# Patient Record
Sex: Female | Born: 1955 | Race: White | Hispanic: No | Marital: Married | Smoking: Never smoker
Health system: Southern US, Community
[De-identification: ages and names within clinical notes are randomized; demographics above are authoritative.]

## PROBLEM LIST (undated history)

## (undated) DIAGNOSIS — F329 Major depressive disorder, single episode, unspecified: Secondary | ICD-10-CM

## (undated) DIAGNOSIS — F32A Depression, unspecified: Secondary | ICD-10-CM

## (undated) DIAGNOSIS — E785 Hyperlipidemia, unspecified: Secondary | ICD-10-CM

## (undated) DIAGNOSIS — F419 Anxiety disorder, unspecified: Secondary | ICD-10-CM

## (undated) HISTORY — PX: CHOLECYSTECTOMY: SHX55

## (undated) HISTORY — DX: Anxiety disorder, unspecified: F41.9

## (undated) HISTORY — DX: Hyperlipidemia, unspecified: E78.5

## (undated) HISTORY — DX: Depression, unspecified: F32.A

---
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
## 2019-09-04 ENCOUNTER — Encounter: Payer: Self-pay | Admitting: Emergency Medicine

## 2019-09-04 ENCOUNTER — Other Ambulatory Visit: Payer: Self-pay

## 2019-09-04 DIAGNOSIS — I951 Orthostatic hypotension: Secondary | ICD-10-CM | POA: Insufficient documentation

## 2019-09-04 DIAGNOSIS — R21 Rash and other nonspecific skin eruption: Secondary | ICD-10-CM | POA: Diagnosis not present

## 2019-09-04 DIAGNOSIS — R55 Syncope and collapse: Secondary | ICD-10-CM | POA: Diagnosis present

## 2019-09-04 DIAGNOSIS — E119 Type 2 diabetes mellitus without complications: Secondary | ICD-10-CM | POA: Insufficient documentation

## 2019-09-04 DIAGNOSIS — R911 Solitary pulmonary nodule: Secondary | ICD-10-CM | POA: Diagnosis not present

## 2019-09-04 LAB — BASIC METABOLIC PANEL
Anion gap: 11 (ref 5–15)
BUN: 15 mg/dL (ref 8–23)
CO2: 21 mmol/L — ABNORMAL LOW (ref 22–32)
Calcium: 7.8 mg/dL — ABNORMAL LOW (ref 8.9–10.3)
Chloride: 99 mmol/L (ref 98–111)
Creatinine, Ser: 1.01 mg/dL — ABNORMAL HIGH (ref 0.44–1.00)
GFR calc Af Amer: >60 mL/min (ref >60)
GFR calc non Af Amer: 59 mL/min — ABNORMAL LOW (ref >60)
Glucose, Bld: 224 mg/dL — ABNORMAL HIGH (ref 70–99)
Potassium: 3.5 mmol/L (ref 3.5–5.1)
Sodium: 131 mmol/L — ABNORMAL LOW (ref 135–145)

## 2019-09-04 LAB — CBC
HCT: 43.7 % (ref 36.0–46.0)
Hemoglobin: 14.6 g/dL (ref 12.0–15.0)
MCH: 30.2 pg (ref 26.0–34.0)
MCHC: 33.4 g/dL (ref 30.0–36.0)
MCV: 90.3 fL (ref 80.0–100.0)
Platelets: 212 10*3/uL (ref 150–400)
RBC: 4.84 MIL/uL (ref 3.87–5.11)
RDW: 12.9 % (ref 11.5–15.5)
WBC: 11.3 10*3/uL — ABNORMAL HIGH (ref 4.0–10.5)
nRBC: 0 % (ref 0.0–0.2)

## 2019-09-04 NOTE — ED Triage Notes (Signed)
Pt arrives POV to triage with c/o syncopal episode around 2200. Pt states that she has just been diagnosed with shingles. Pt reports that she is healing from the shingles at this time.

## 2019-09-05 ENCOUNTER — Emergency Department: Payer: Medicare Other

## 2019-09-05 ENCOUNTER — Emergency Department
Admission: EM | Admit: 2019-09-05 | Discharge: 2019-09-05 | Disposition: A | Discharge status: Home / Self Care | Payer: Medicare Other | Attending: Emergency Medicine | Admitting: Emergency Medicine

## 2019-09-05 DIAGNOSIS — R55 Syncope and collapse: Secondary | ICD-10-CM

## 2019-09-05 DIAGNOSIS — R911 Solitary pulmonary nodule: Secondary | ICD-10-CM

## 2019-09-05 DIAGNOSIS — I951 Orthostatic hypotension: Secondary | ICD-10-CM

## 2019-09-05 LAB — URINALYSIS, COMPLETE (UACMP) WITH MICROSCOPIC
Bilirubin Urine: NEGATIVE
Glucose, UA: NEGATIVE mg/dL
Hgb urine dipstick: NEGATIVE
Ketones, ur: NEGATIVE mg/dL
Nitrite: NEGATIVE
Protein, ur: NEGATIVE mg/dL
Specific Gravity, Urine: 1.01 (ref 1.005–1.030)
pH: 5 (ref 5.0–8.0)

## 2019-09-05 LAB — TROPONIN I (HIGH SENSITIVITY)
Troponin I (High Sensitivity): 3 ng/L (ref <18)
Troponin I (High Sensitivity): <2 ng/L (ref <18)

## 2019-09-05 LAB — FIBRIN DERIVATIVES D-DIMER (ARMC ONLY): Fibrin derivatives D-dimer (ARMC): 4656.57 ng/mL (FEU) — ABNORMAL HIGH (ref 0.00–499.00)

## 2019-09-05 MED ORDER — FOSFOMYCIN TROMETHAMINE 3 G PO PACK
3.0000 g | PACK | Freq: Once | ORAL | Status: AC
  Administered 2019-09-05: 3 g via ORAL
  Filled 2019-09-05: qty 3

## 2019-09-05 MED ORDER — IOHEXOL 350 MG/ML SOLN
75.0000 mL | Freq: Once | INTRAVENOUS | Status: AC | PRN
  Administered 2019-09-05: 75 mL via INTRAVENOUS

## 2019-09-05 MED ORDER — SODIUM CHLORIDE 0.9 % IV BOLUS
1000.0000 mL | Freq: Once | INTRAVENOUS | Status: AC
  Administered 2019-09-05: 04:00:00 1000 mL via INTRAVENOUS

## 2019-09-05 NOTE — ED Notes (Signed)
Patient assisted to the bathroom 

## 2019-09-05 NOTE — Discharge Instructions (Addendum)
Drink plenty of fluids daily.  Get up slowly from a laying down or sitting position to avoid dizziness.  Your doctor will need to keep an eye on your lung nodules, generally with repeat CT scan in 1 year.  Return to the ER for recurrent or worsening symptoms, persistent vomiting, difficulty breathing or other concerns.

## 2019-09-05 NOTE — ED Provider Notes (Signed)
[facility] Emergency Department Provider Note   ____________________________________________   First MD Initiated Contact with Patient 09/05/19 0142     (approximate)  I have reviewed the triage vital signs and the nursing notes.   HISTORY  Chief Complaint Loss of Consciousness    HPI Allison Coleman is a 64 y.o. female who presents to the ED from home with a chief complaint of syncope.  Patient reports getting up to go into the kitchen and around 10 PM, felt lightheaded and passed out briefly.  She has been experiencing left flank pain and rash for the last 2 weeks.  Saw her PCP 3 days ago and diagnosed with shingles.  Because the lesions had already crusted over, PCP just recommended over-the-counter lidocaine, Tylenol/ibuprofen for pain.  Patient was going to get the lidocaine for pain when she passed out.  The night before she noted some shallow breathing because it was painful on her left side to take deep breaths.  Denies fever, cough, chest pain, abdominal pain, nausea, vomiting, diarrhea.       Past medical history GAD Depression Diabetes type 2  There are no problems to display for this patient.   Past Surgical History:  Procedure Laterality Date  . CHOLECYSTECTOMY      Prior to Admission medications   Not on File    Allergies Bupropion, Buspirone, Fluoxetine, Guaifenesin, Other, Penicillin g, and Citalopram  No family history on file.  Social History Social History   Tobacco Use  . Smoking status: Never Smoker  . Smokeless tobacco: Never Used  Substance Use Topics  . Alcohol use: Never  . Drug use: Never    Review of Systems  Constitutional: No fever/chills Eyes: No visual changes. ENT: No sore throat. Cardiovascular: Denies chest pain. Respiratory: Denies shortness of breath. Gastrointestinal: No abdominal pain.  No nausea, no vomiting.  No diarrhea.  No constipation. Genitourinary: Negative for  dysuria. Musculoskeletal: Negative for back pain. Skin: Positive for rash. Neurological: Positive for syncope.  Negative for headaches, focal weakness or numbness.   ____________________________________________   PHYSICAL EXAM:  VITAL SIGNS: ED Triage Vitals  Enc Vitals Group     BP 09/04/19 2303 (!) 96/53     Pulse Rate 09/04/19 2303 (!) 118     Resp 09/04/19 2303 18     Temp 09/04/19 2303 98.3 F (36.8 C)     Temp Source 09/04/19 2303 Oral     SpO2 09/04/19 2303 97 %     Weight 09/04/19 2257 200 lb (90.7 kg)     Height 09/04/19 2257 5\' 5"  (1.651 m)     Head Circumference --      Peak Flow --      Pain Score 09/04/19 2257 0     Pain Loc --      Pain Edu? --      Excl. in GC? --     Constitutional: Alert and oriented. Well appearing and in no acute distress. Eyes: Conjunctivae are normal. PERRL. EOMI. Head: Atraumatic. Nose: Atraumatic. Mouth/Throat: Mucous membranes are mildly dry.  No dental malocclusion. Neck: No stridor.  No cervical spine tenderness to palpation. Cardiovascular: Tachycardic rate, regular rhythm. Grossly normal heart sounds.  Good peripheral circulation. Respiratory: Normal respiratory effort.  No retractions. Lungs CTAB. Gastrointestinal: Soft and nontender to light or deep palpation. No distention. No abdominal bruits. No CVA tenderness. Musculoskeletal: No lower extremity tenderness nor edema.  No joint effusions. Neurologic:  Normal speech and language. No gross focal  neurologic deficits are appreciated.  Skin:  Skin is warm, dry and intact.  Dried vesicles noted to left trunk. Psychiatric: Mood and affect are normal. Speech and behavior are normal.  ____________________________________________   LABS (all labs ordered are listed, but only abnormal results are displayed)  Labs Reviewed  BASIC METABOLIC PANEL - Abnormal; Notable for the following components:      Result Value   Sodium 131 (*)    CO2 21 (*)    Glucose, Bld 224 (*)     Creatinine, Ser 1.01 (*)    Calcium 7.8 (*)    GFR calc non Af Amer 59 (*)    All other components within normal limits  CBC - Abnormal; Notable for the following components:   WBC 11.3 (*)    All other components within normal limits  URINALYSIS, COMPLETE (UACMP) WITH MICROSCOPIC - Abnormal; Notable for the following components:   Color, Urine YELLOW (*)    APPearance HAZY (*)    Leukocytes,Ua SMALL (*)    Bacteria, UA RARE (*)    All other components within normal limits  FIBRIN DERIVATIVES D-DIMER ([facility] ONLY) - Abnormal; Notable for the following components:   Fibrin derivatives D-dimer [facility]) 0,277.41 (*)    All other components within normal limits  CBG MONITORING, ED  TROPONIN I (HIGH SENSITIVITY)  TROPONIN I (HIGH SENSITIVITY)   ____________________________________________  EKG  ED ECG REPORT I, Elain Wixon J, the attending physician, personally viewed and interpreted this ECG.   Date: 09/05/2019  EKG Time: 2307  Rate: 117  Rhythm: sinus tachycardia  Axis: Normal  Intervals:none  ST&T Change: Nonspecific  ____________________________________________  RADIOLOGY  ED MD interpretation: No acute cardiopulmonary process on chest x-ray; no ICH on CT head; no PE on CT chest, incidental pulmonary nodules  Official radiology report(s): CT Head Wo Contrast  Result Date: 09/05/2019 CLINICAL DATA:  Recent syncopal episode EXAM: CT HEAD WITHOUT CONTRAST TECHNIQUE: Contiguous axial images were obtained from the base of the skull through the vertex without intravenous contrast. COMPARISON:  None. FINDINGS: Brain: Generalized atrophic changes are noted commensurate with the patient's given age. No findings to suggest acute hemorrhage, acute infarction or space-occupying mass lesion are seen. Vascular: No hyperdense vessel or unexpected calcification. Skull: Normal. Negative for fracture or focal lesion. Sinuses/Orbits: No acute finding. Other: None. IMPRESSION: Atrophic changes  without acute abnormality. Electronically Signed   By: Alcide Clever M.D.   On: 09/05/2019 02:39   CT Angio Chest PE W/Cm &/Or Wo Cm  Result Date: 09/05/2019 CLINICAL DATA:  Shortness of breath. EXAM: CT ANGIOGRAPHY CHEST WITH CONTRAST TECHNIQUE: Multidetector CT imaging of the chest was performed using the standard protocol during bolus administration of intravenous contrast. Multiplanar CT image reconstructions and MIPs were obtained to evaluate the vascular anatomy. CONTRAST:  59mL OMNIPAQUE IOHEXOL 350 MG/ML SOLN COMPARISON:  One-view chest x-ray 09/05/2019 FINDINGS: Cardiovascular: Heart size is normal. Aorta and great vessel origins are within normal limits. Coronary artery calcifications are present. Pulmonary artery opacification is satisfactory. No focal filling defects are present to suggest pulmonary emboli. The study is mildly degraded by patient breathing motion. Mediastinum/Nodes: No significant mediastinal hilar, or axillary adenopathy is present. The thoracic inlet is within normal limits. Esophagus is normal. Lungs/Pleura: A small left pleural effusion is present. Associated atelectasis is noted. 3 mm pulmonary nodule is present in the left upper lobe on image 22 of 78. 4 mm nodule is present in the superior segment of the right lower lobe on image 37  of 78. No other focal nodule, mass, or airspace disease is present. Upper Abdomen: Diffuse fatty infiltration of the liver is noted. No discrete lesions are present. Limited visualization of the upper abdomen is otherwise unremarkable. Musculoskeletal: Rightward curvature is present in thoracic spine. Vertebral body heights and alignment are maintained. No focal lytic or blastic lesions are present. Sternum is within normal limits. Ribs are unremarkable. Review of the MIP images confirms the above findings. IMPRESSION: 1. No pulmonary embolus. 2. Small left pleural effusion and associated atelectasis. 3. Coronary artery disease. 4. Hepatic steatosis.  5. Rightward curvature of the thoracic spine. 6. 3 mm pulmonary nodule in the left upper lobe. 4 mm pulmonary nodule in the superior segment of the right lower lobe. No follow-up needed if patient is low-risk (and has no known or suspected primary neoplasm). Non-contrast chest CT can be considered in 12 months if patient is high-risk. This recommendation follows the consensus statement: Guidelines for Management of Incidental Pulmonary Nodules Detected on CT Images: From the Fleischner Society 2017; Radiology 2017; 284:228-243. Electronically Signed   By: Marin Roberts M.D.   On: 09/05/2019 05:52   DG Chest Port 1 View  Result Date: 09/05/2019 CLINICAL DATA:  Shortness of breath EXAM: PORTABLE CHEST 1 VIEW COMPARISON:  None. FINDINGS: The heart size and mediastinal contours are within normal limits. Both lungs are clear. No acute osseous abnormality. IMPRESSION: No active disease. Electronically Signed   By: Jonna Clark M.D.   On: 09/05/2019 02:28    ____________________________________________   PROCEDURES  Procedure(s) performed (including Critical Care):  Procedures   ____________________________________________   INITIAL IMPRESSION / ASSESSMENT AND PLAN / ED COURSE  As part of my medical decision making, I reviewed the following data within the electronic MEDICAL RECORD NUMBER Nursing notes reviewed and incorporated, Labs reviewed, EKG interpreted, Old chart reviewed, Radiograph reviewed and Notes from prior ED visits     Bryony Kaman was evaluated in Emergency Department on 09/05/2019 for the symptoms described in the history of present illness. She was evaluated in the context of the global COVID-19 pandemic, which necessitated consideration that the patient might be at risk for infection with the SARS-CoV-2 virus that causes COVID-19. Institutional protocols and algorithms that pertain to the evaluation of patients at risk for COVID-19 are in a state of rapid change based on  information released by regulatory bodies including the CDC and federal and state organizations. These policies and algorithms were followed during the patient's care in the ED.    64 year old female who presents status post syncopal episode.  Differential diagnosis includes but is not limited to CVA, ICH, orthostasis, ACS, metabolic, infectious etiologies, etc.  Patient + orthostatics.  Initiate IV fluid resuscitation.  Cardiac work-up including D-dimer, CT head and chest x-ray.  Will reassess.   Clinical Course as of Sep 05 631  Mon Sep 05, 2019  0501 Elevated D-dimer noted.  Will proceed with CTA chest to evaluate for PE.   [JS]  0555 Updated patient on all test results.  She is resting in no acute distress.  Dose fosfomycin prior to discharge.  She will follow-up closely with her PCP this week.  Strict return precautions given.  Patient verbalizes understanding agrees with plan of care.   [JS]    Clinical Course User Index [JS] Irean Hong, MD     ____________________________________________   FINAL CLINICAL IMPRESSION(S) / ED DIAGNOSES  Final diagnoses:  Syncope, unspecified syncope type  Orthostasis  Pulmonary nodule  ED Discharge Orders    None       Note:  This document was prepared using Dragon voice recognition software and may include unintentional dictation errors.   Paulette Blanch, MD 09/05/19 [phone]

## 2019-10-10 ENCOUNTER — Ambulatory Visit (INDEPENDENT_AMBULATORY_CARE_PROVIDER_SITE_OTHER): Payer: Medicare Other | Admitting: Cardiovascular Disease

## 2019-10-10 ENCOUNTER — Other Ambulatory Visit: Payer: Self-pay

## 2019-10-10 ENCOUNTER — Encounter: Payer: Self-pay | Admitting: Cardiovascular Disease

## 2019-10-10 VITALS — BP 122/76 | HR 105 | Temp 97.2°F | Ht 65.0 in | Wt 202.0 lb

## 2019-10-10 DIAGNOSIS — I951 Orthostatic hypotension: Secondary | ICD-10-CM | POA: Diagnosis not present

## 2019-10-10 DIAGNOSIS — R55 Syncope and collapse: Secondary | ICD-10-CM | POA: Diagnosis not present

## 2019-10-10 DIAGNOSIS — F411 Generalized anxiety disorder: Secondary | ICD-10-CM | POA: Diagnosis not present

## 2019-10-10 DIAGNOSIS — B029 Zoster without complications: Secondary | ICD-10-CM

## 2019-10-10 NOTE — Patient Instructions (Signed)
Fluids!!!  Medication Instructions:  No changes  If you need a refill on your cardiac medications before your next appointment, please call your pharmacy.    Lab work: No new labs needed   If you have labs (blood work) drawn today and your tests are completely normal, you will receive your results only by: Marland Kitchen MyChart Message (if you have MyChart) OR . A paper copy in the mail If you have any lab test that is abnormal or we need to change your treatment, we will call you to review the results.   Testing/Procedures: No new testing needed   Follow-Up: At [facility], you and your health needs are our priority.  As part of our continuing mission to provide you with exceptional heart care, we have created designated Provider Care Teams.  These Care Teams include your primary Cardiologist (physician) and Advanced Practice Providers (APPs -  Physician Assistants and Nurse Practitioners) who all work together to provide you with the care you need, when you need it.  . You will need a follow up appointment as needed .    Marland Kitchen Providers on your designated Care Team:   . Nicolasa Ducking, NP . Eula Listen, PA-C . Marisue Ivan, PA-C  Any Other Special Instructions Will Be Listed Below (If Applicable).  For educational health videos Log in to : [email or web address] Or : [email or web address], password : [email or web address]

## 2019-10-10 NOTE — Progress Notes (Signed)
Cardiology Office Note  Date:  10/10/2019   ID:  Allison Coleman, DOB 11-Jun-1956, MRN [id number]  PCP:  [facility]   Chief Complaint  Patient presents with  . New Patient (Initial Visit)    ED F/U-Syncope. Patient states dizziness has resolved since drinking more water; Meds verbally reviewed with patient.    HPI:  Ms. Tinie Mcgloin is a 64 year old woman with past medical history of General anxiety disorder/depression Diabetes type 2 Shingles IBS Referred by Genn[facility] Pac for angina, syncope  Seen in the emergency room September 04, 2019 for syncope Have been just diagnosed with shingles, was still healing  Reports not a big drinker of fluids, 1 16oz a day getting up to go into the kitchen and around 10 PM, felt lightheaded and passed out briefly.  Has had left flank pain and rash for 2 weeks prior to syncope  diagnosed with shingles.   lesions had already crusted over, PCP just recommended over-the-counter lidocaine, Tylenol/ibuprofen for pain.  Patient was going to get the lidocaine for pain when she passed out.  The night before she noted some shallow breathing because it was painful on her left side to take deep breaths.    Blood pressure in the emergency room 96/53 heart rate 118 Sodium 131, glucose 224, elevated D-dimer  CT chest Coronary calcification noted Mild LAD and proximal RCA Fatty liver  She was given IV fluids as she was orthostatic  Now drinking more, no near syncope  Thinks HBA1C 6.5 Poor diet, lots of bread  EKG personally reviewed by myself on todays visit Sinus tach rate 105 no ST or T wave changes   PMH:   has a past medical history of Anxiety, Depression, and Hyperlipidemia.  PSH:    Past Surgical History:  Procedure Laterality Date  . CHOLECYSTECTOMY      Current Outpatient Medications  Medication Sig Dispense Refill  . atorvastatin (LIPITOR) 10 MG tablet Take 10 mg by mouth daily.    . colestipol  (COLESTID) 1 g tablet Take 1 g by mouth daily.    . diazepam (VALIUM) 2 MG tablet Take 2 mg by mouth 3 (three) times daily.    Marland Kitchen LACTOBACILLUS PROBIOTIC PO Take 1 tablet by mouth daily.    Marland Kitchen lisinopril (ZESTRIL) 2.5 MG tablet Take 2.5 mg by mouth daily.    . QUEtiapine (SEROQUEL) 100 MG tablet Take 50 mg by mouth at bedtime.    Marland Kitchen venlafaxine XR (EFFEXOR-XR) 150 MG 24 hr capsule Take 300 mg by mouth daily.    Marland Kitchen venlafaxine XR (EFFEXOR-XR) 75 MG 24 hr capsule Take 75 mg by mouth daily with breakfast.     No current facility-administered medications for this visit.     Allergies:   Bupropion, Buspirone, Fluoxetine, Guaifenesin, Other, Penicillin g, and Citalopram   Social History:  The patient  reports that she has never smoked. She has never used smokeless tobacco. She reports that she does not drink alcohol or use drugs.   Family History:   family history includes Heart attack in her maternal grandmother and mother.    Review of Systems: Review of Systems  Constitutional: Negative.   HENT: Negative.   Respiratory: Negative.   Cardiovascular: Negative.   Gastrointestinal: Negative.   Musculoskeletal: Negative.   Neurological: Negative.   Psychiatric/Behavioral: Negative.   All other systems reviewed and are negative.   PHYSICAL EXAM: VS:  BP 122/76 (BP Location: Right Arm, Patient Position: Sitting)   Pulse (!) 105  Temp (!) 97.2 F (36.2 C)   Ht 5\' 5"  (1.651 m)   Wt 202 lb (91.6 kg)   SpO2 96%   BMI 33.61 kg/m  , BMI Body mass index is 33.61 kg/m. GEN: Well nourished, well developed, in no acute distress HEENT: normal Neck: no JVD, carotid bruits, or masses Cardiac: RRR; no murmurs, rubs, or gallops,no edema  Respiratory:  clear to auscultation bilaterally, normal work of breathing GI: soft, nontender, nondistended, + BS MS: no deformity or atrophy Skin: warm and dry, no rash Neuro:  Strength and sensation are intact Psych: euthymic mood, full affect   Recent  Labs: 09/04/2019: BUN 15; Creatinine, Ser 1.01; Hemoglobin 14.6; Platelets 212; Potassium 3.5; Sodium 131    Lipid Panel No results found for: CHOL, HDL, LDLCALC, TRIG    Wt Readings from Last 3 Encounters:  10/10/19 202 lb (91.6 kg)  09/04/19 200 lb (90.7 kg)      ASSESSMENT AND PLAN:  Problem List Items Addressed This Visit    None    Visit Diagnoses    Syncope and collapse    -  Primary   Relevant Medications   atorvastatin (LIPITOR) 10 MG tablet   colestipol (COLESTID) 1 g tablet   lisinopril (ZESTRIL) 2.5 MG tablet   Other Relevant Orders   EKG 12-Lead   GAD (generalized anxiety disorder)       Relevant Medications   diazepam (VALIUM) 2 MG tablet   venlafaxine XR (EFFEXOR-XR) 150 MG 24 hr capsule   venlafaxine XR (EFFEXOR-XR) 75 MG 24 hr capsule   Herpes zoster without complication       Orthostasis       Relevant Medications   atorvastatin (LIPITOR) 10 MG tablet   colestipol (COLESTID) 1 g tablet   lisinopril (ZESTRIL) 2.5 MG tablet      Orthostatic hypotension/syncope In the setting of low pressure, poor fluid intake, and shingles Now drinking more No near syncope or syncope Orthostatics performed today only with slight increase in heart rate, no significant drop in blood pressure -No further testing needed  Sinus tachycardia Likely longstanding, though unable to exclude still recovering from shingles Asymptomatic, not a good candidate for beta-blockers She does have a pulse monitor and she will continue to monitor her pulse with activity We have recommended low-grade daily exercises for conditioning Some of the spikes in heart rate with activity likely from deconditioning  Coronary calcification on CT scan Images pulled up, noted in the LAD, possibly the proximal RCA Minimal if any in the aorta, none in the carotids Goal LDL less than 70 She denies any anginal symptoms, no further testing at this time  Diabetes type 2 Poor diet, recommend she work  on lifestyle modification and effort to get LDL less than 70, hemoglobin A1c less than 6   Disposition:   F/U as needed   Total encounter time more than 45 minutes  Greater than 50% was spent in counseling and coordination of care with the patient    Signed, 09/06/19, M.D., Ph.D. [facility] [place], [place]

## 2019-12-02 ENCOUNTER — Other Ambulatory Visit: Payer: Self-pay | Admitting: Family Medicine

## 2019-12-02 DIAGNOSIS — Z1231 Encounter for screening mammogram for malignant neoplasm of breast: Secondary | ICD-10-CM

## 2021-02-17 IMAGING — CT CT HEAD W/O CM
3 series · 16 of 47 positions shown, 19 images · non-contrast
Comparison: None.

CLINICAL DATA: Recent syncopal episode

EXAM:
CT HEAD WITHOUT CONTRAST
TECHNIQUE: Contiguous axial images were obtained from the base of the skull
through the vertex without intravenous contrast.

[Series 2: head wo · axial · 0.42mm/px · z∈[-102,+23]mm · 10 of 31 slices shown, 13 images]
[im 3/31  brain]
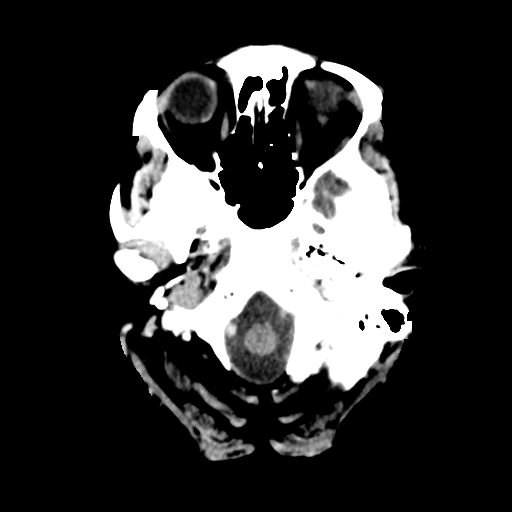
[im 3/31  bone]
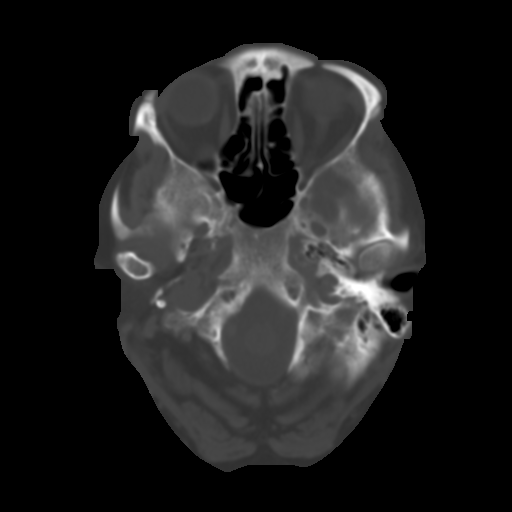
[im 6/31  brain]
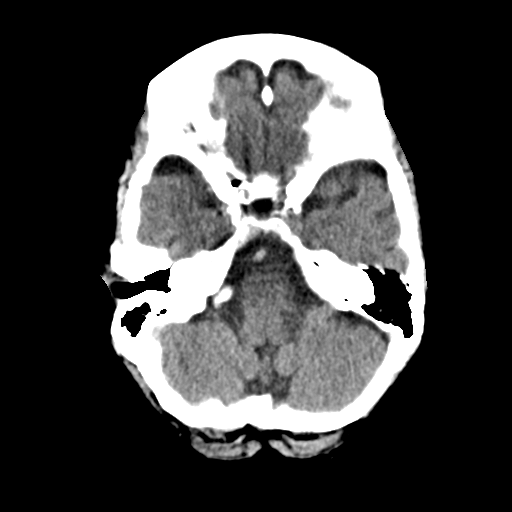
[im 9/31  brain]
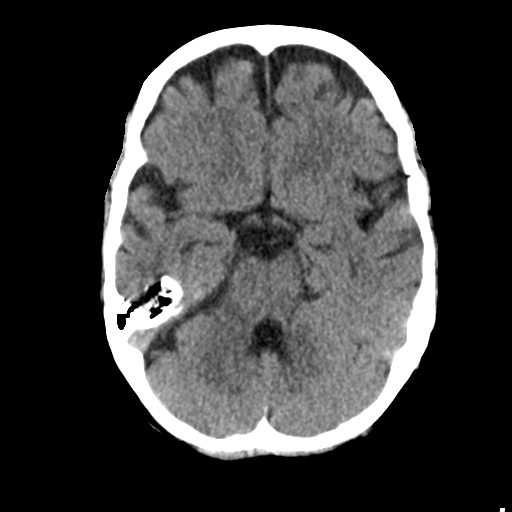
[im 11/31  brain]
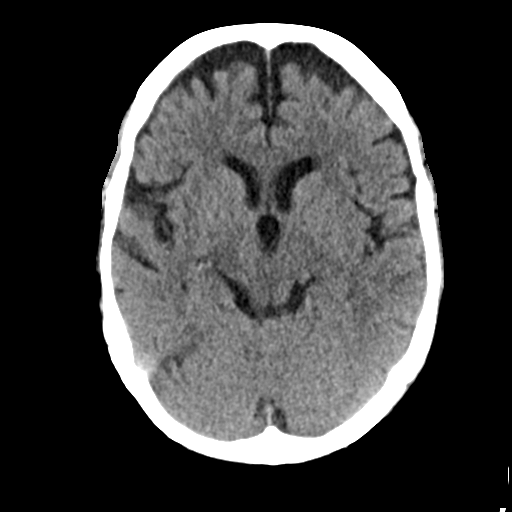
[im 14/31  brain]
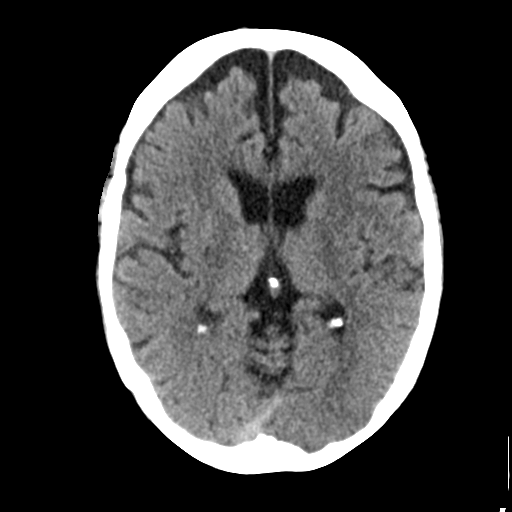
[im 14/31  bone]
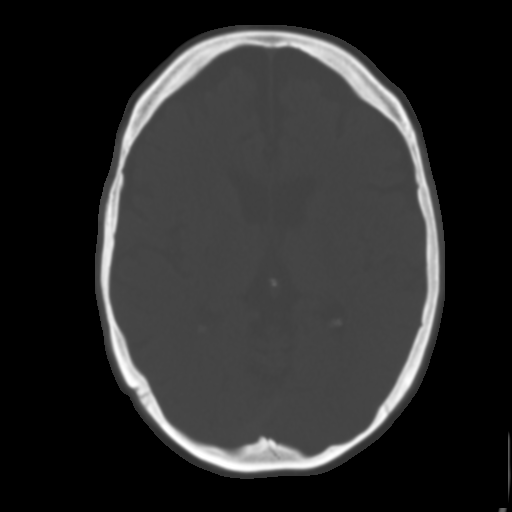
[im 17/31  brain]
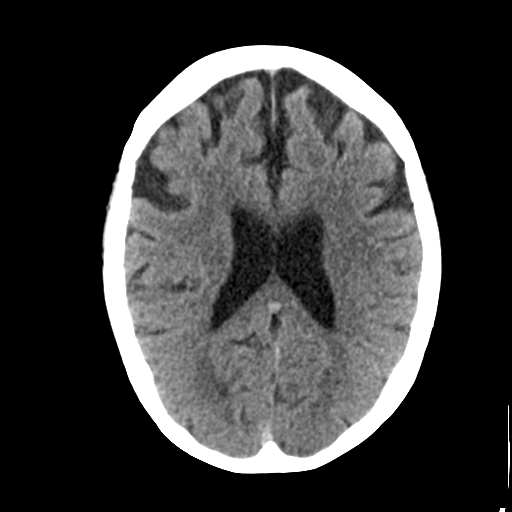
[im 20/31  brain]
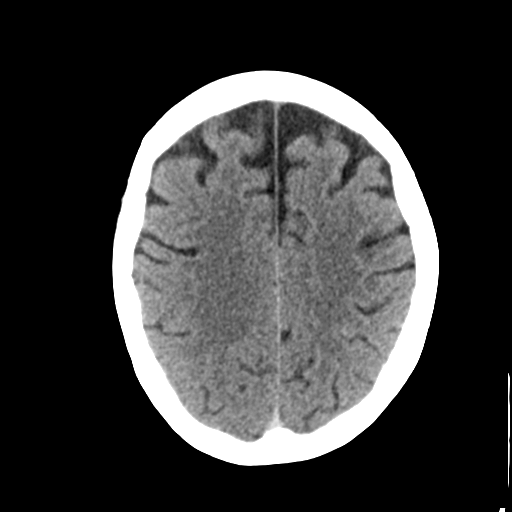
[im 23/31  brain]
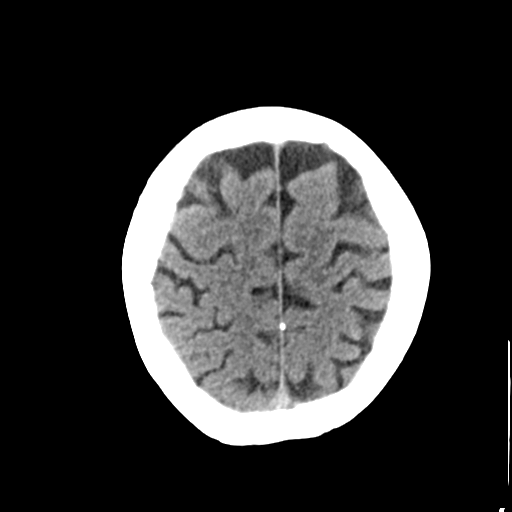
[im 25/31  brain]
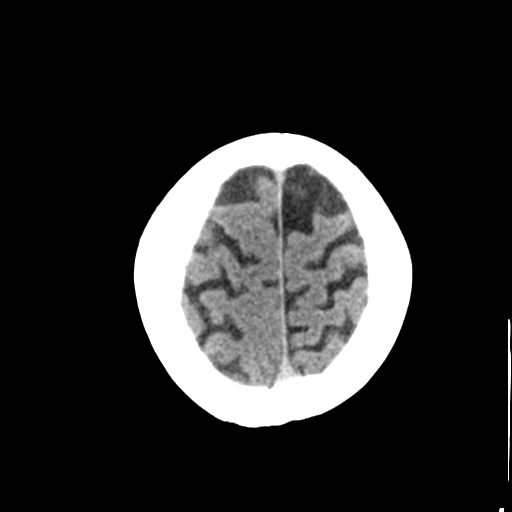
[im 25/31  bone]
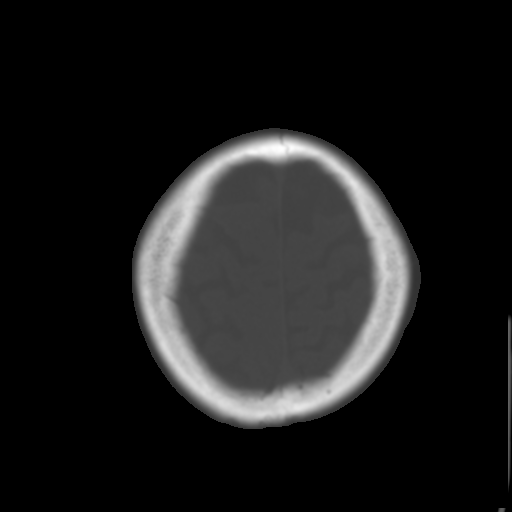
[im 28/31  brain]
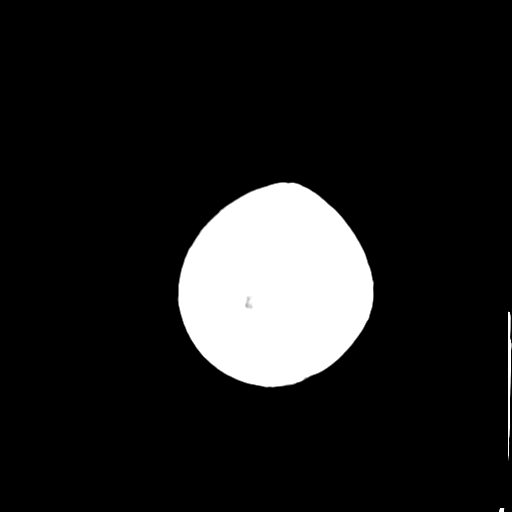

[Series 4: coronal soft tissue · coronal · 0.30mm/px · 3 of 64 slices shown]
[im 22/64  brain]
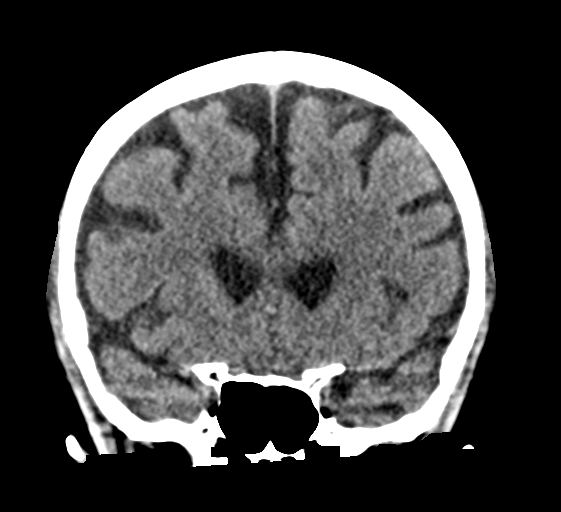
[im 29/64  brain]
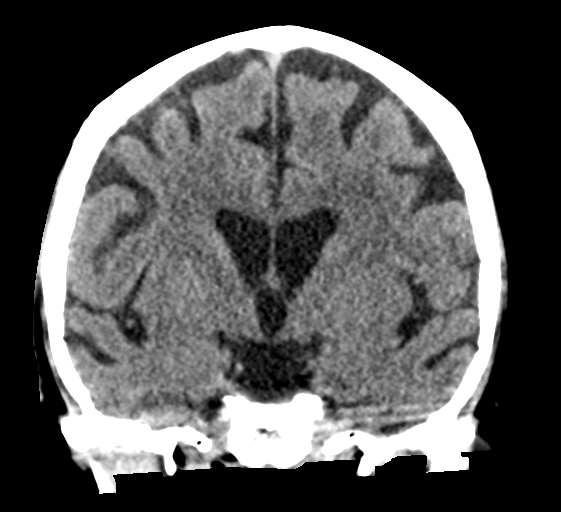
[im 36/64  brain]
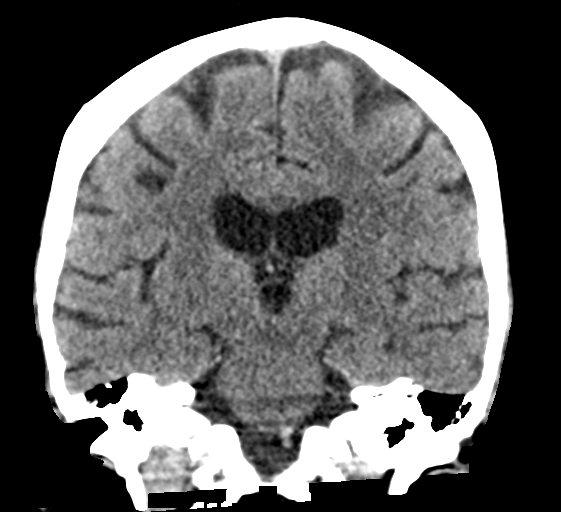

[Series 5: sagittal soft tissue · sagittal · 0.33mm/px · 3 of 49 slices shown]
[im 17/49  brain]
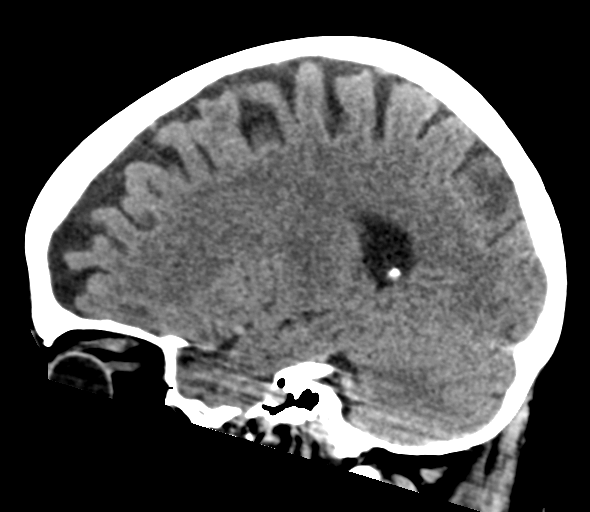
[im 25/49  brain]
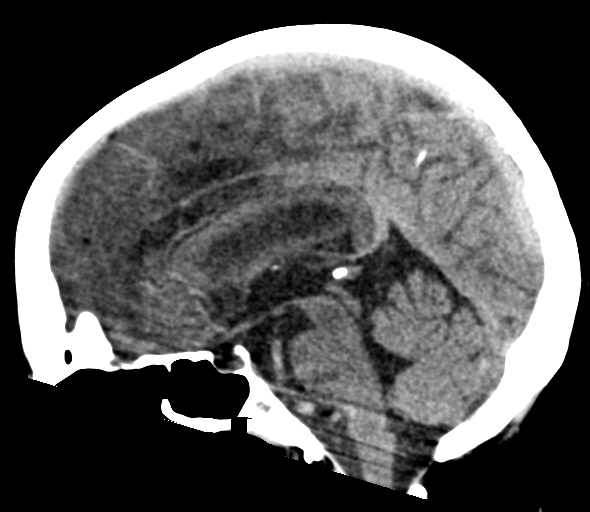
[im 33/49  brain]
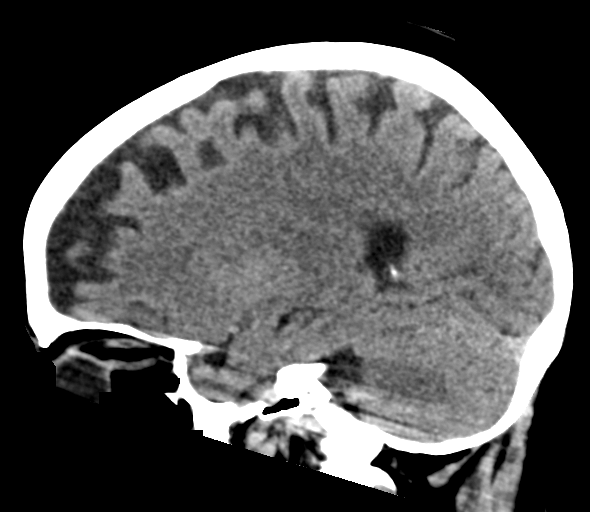

[16 of 47 positions shown; findings below may reference images not displayed]

FINDINGS: Brain: Generalized atrophic changes are noted commensurate with the
patient's given age. No findings to suggest acute hemorrhage, acute
infarction or space-occupying mass lesion are seen.

Vascular: No hyperdense vessel or unexpected calcification.

Skull: Normal. Negative for fracture or focal lesion.

Sinuses/Orbits: No acute finding.

Other: None.
IMPRESSION: Atrophic changes without acute abnormality.
